# Patient Record
Sex: Male | Born: 1935
Health system: Southern US, Community
[De-identification: ages and names within clinical notes are randomized; demographics above are authoritative.]

## PROBLEM LIST (undated history)

## (undated) DIAGNOSIS — Z8546 Personal history of malignant neoplasm of prostate: Secondary | ICD-10-CM

## (undated) DIAGNOSIS — E785 Hyperlipidemia, unspecified: Secondary | ICD-10-CM

## (undated) DIAGNOSIS — I10 Essential (primary) hypertension: Secondary | ICD-10-CM

## (undated) DIAGNOSIS — E7800 Pure hypercholesterolemia, unspecified: Secondary | ICD-10-CM

## (undated) DIAGNOSIS — K219 Gastro-esophageal reflux disease without esophagitis: Secondary | ICD-10-CM

## (undated) DIAGNOSIS — C61 Malignant neoplasm of prostate: Secondary | ICD-10-CM

## (undated) DIAGNOSIS — N200 Calculus of kidney: Secondary | ICD-10-CM

## (undated) DIAGNOSIS — E119 Type 2 diabetes mellitus without complications: Secondary | ICD-10-CM

## (undated) DIAGNOSIS — M109 Gout, unspecified: Secondary | ICD-10-CM

## (undated) DIAGNOSIS — I700 Atherosclerosis of aorta: Secondary | ICD-10-CM

## (undated) HISTORY — DX: Gastro-esophageal reflux disease without esophagitis: K21.9

## (undated) HISTORY — DX: Atherosclerosis of aorta: I70.0

## (undated) HISTORY — DX: Malignant neoplasm of prostate: C61

## (undated) HISTORY — DX: Pure hypercholesterolemia, unspecified: E78.00

## (undated) HISTORY — DX: Type 2 diabetes mellitus without complications: E11.9

## (undated) HISTORY — DX: Calculus of kidney: N20.0

## (undated) HISTORY — DX: Essential (primary) hypertension: I10

## (undated) HISTORY — DX: Personal history of malignant neoplasm of prostate: Z85.46

## (undated) HISTORY — DX: Hyperlipidemia, unspecified: E78.5

## (undated) HISTORY — DX: Gout, unspecified: M10.9

---

## 1999-08-13 ENCOUNTER — Encounter: Admission: RE | Admit: 1999-08-13 | Discharge: 1999-08-13 | Payer: Self-pay | Admitting: *Deleted

## 2000-06-11 ENCOUNTER — Ambulatory Visit (HOSPITAL_COMMUNITY): Admission: RE | Admit: 2000-06-11 | Discharge: 2000-06-11 | Payer: Self-pay | Admitting: *Deleted

## 2000-10-11 ENCOUNTER — Encounter: Payer: Self-pay | Admitting: Emergency Medicine

## 2000-10-11 ENCOUNTER — Emergency Department (HOSPITAL_COMMUNITY): Admission: EM | Admit: 2000-10-11 | Discharge: 2000-10-11 | Payer: Self-pay | Admitting: Emergency Medicine

## 2002-04-17 ENCOUNTER — Encounter: Payer: Self-pay | Admitting: Emergency Medicine

## 2002-04-17 ENCOUNTER — Ambulatory Visit (HOSPITAL_BASED_OUTPATIENT_CLINIC_OR_DEPARTMENT_OTHER): Admission: RE | Admit: 2002-04-17 | Discharge: 2002-04-17 | Payer: Self-pay | Admitting: Urology

## 2002-04-17 ENCOUNTER — Encounter: Payer: Self-pay | Admitting: Urology

## 2002-04-17 ENCOUNTER — Emergency Department (HOSPITAL_COMMUNITY): Admission: EM | Admit: 2002-04-17 | Discharge: 2002-04-17 | Payer: Self-pay | Admitting: Emergency Medicine

## 2002-05-10 ENCOUNTER — Encounter: Admission: RE | Admit: 2002-05-10 | Discharge: 2002-05-10 | Payer: Self-pay | Admitting: Urology

## 2002-05-10 ENCOUNTER — Encounter: Payer: Self-pay | Admitting: Urology

## 2002-07-24 ENCOUNTER — Encounter: Payer: Self-pay | Admitting: Urology

## 2002-07-24 ENCOUNTER — Encounter: Admission: RE | Admit: 2002-07-24 | Discharge: 2002-07-24 | Payer: Self-pay | Admitting: Urology

## 2002-08-14 ENCOUNTER — Encounter: Admission: RE | Admit: 2002-08-14 | Discharge: 2002-08-14 | Payer: Self-pay | Admitting: Urology

## 2002-08-14 ENCOUNTER — Encounter: Payer: Self-pay | Admitting: Urology

## 2002-08-17 ENCOUNTER — Encounter: Payer: Self-pay | Admitting: Urology

## 2002-08-17 ENCOUNTER — Ambulatory Visit (HOSPITAL_BASED_OUTPATIENT_CLINIC_OR_DEPARTMENT_OTHER): Admission: RE | Admit: 2002-08-17 | Discharge: 2002-08-17 | Payer: Self-pay | Admitting: Urology

## 2002-09-07 ENCOUNTER — Encounter: Admission: RE | Admit: 2002-09-07 | Discharge: 2002-09-07 | Payer: Self-pay | Admitting: Urology

## 2002-09-07 ENCOUNTER — Encounter: Payer: Self-pay | Admitting: Urology

## 2002-09-14 ENCOUNTER — Encounter: Payer: Self-pay | Admitting: Emergency Medicine

## 2002-09-14 ENCOUNTER — Emergency Department (HOSPITAL_COMMUNITY): Admission: EM | Admit: 2002-09-14 | Discharge: 2002-09-14 | Payer: Self-pay | Admitting: Emergency Medicine

## 2003-09-03 ENCOUNTER — Encounter: Admission: RE | Admit: 2003-09-03 | Discharge: 2003-09-03 | Payer: Self-pay | Admitting: Family Medicine

## 2003-09-03 ENCOUNTER — Encounter: Payer: Self-pay | Admitting: Family Medicine

## 2004-04-01 ENCOUNTER — Emergency Department (HOSPITAL_COMMUNITY): Admission: EM | Admit: 2004-04-01 | Discharge: 2004-04-01 | Payer: Self-pay | Admitting: Emergency Medicine

## 2005-01-17 ENCOUNTER — Emergency Department (HOSPITAL_COMMUNITY): Admission: EM | Admit: 2005-01-17 | Discharge: 2005-01-17 | Payer: Self-pay | Admitting: Emergency Medicine

## 2005-04-11 ENCOUNTER — Emergency Department (HOSPITAL_COMMUNITY): Admission: EM | Admit: 2005-04-11 | Discharge: 2005-04-12 | Payer: Self-pay | Admitting: Emergency Medicine

## 2006-03-11 ENCOUNTER — Emergency Department (HOSPITAL_COMMUNITY): Admission: EM | Admit: 2006-03-11 | Discharge: 2006-03-12 | Payer: Self-pay | Admitting: Emergency Medicine

## 2007-06-09 ENCOUNTER — Emergency Department (HOSPITAL_COMMUNITY): Admission: EM | Admit: 2007-06-09 | Discharge: 2007-06-09 | Payer: Self-pay | Admitting: Emergency Medicine

## 2008-02-24 ENCOUNTER — Emergency Department (HOSPITAL_COMMUNITY): Admission: EM | Admit: 2008-02-24 | Discharge: 2008-02-25 | Payer: Self-pay | Admitting: Emergency Medicine

## 2010-07-21 ENCOUNTER — Ambulatory Visit: Admission: RE | Admit: 2010-07-21 | Discharge: 2010-08-06 | Payer: Self-pay | Admitting: Radiation Oncology

## 2010-09-08 ENCOUNTER — Ambulatory Visit
Admission: RE | Admit: 2010-09-08 | Discharge: 2010-11-17 | Payer: Self-pay | Source: Home / Self Care | Attending: Radiation Oncology | Admitting: Radiation Oncology

## 2010-09-10 ENCOUNTER — Ambulatory Visit (HOSPITAL_COMMUNITY): Admission: RE | Admit: 2010-09-10 | Discharge: 2010-09-10 | Payer: Self-pay | Admitting: Urology

## 2010-09-29 ENCOUNTER — Ambulatory Visit (HOSPITAL_COMMUNITY)
Admission: RE | Admit: 2010-09-29 | Discharge: 2010-09-29 | Payer: Self-pay | Source: Home / Self Care | Admitting: Urology

## 2010-11-30 ENCOUNTER — Encounter: Payer: Self-pay | Admitting: Radiation Oncology

## 2010-12-16 ENCOUNTER — Ambulatory Visit
Admission: RE | Admit: 2010-12-16 | Payer: BC Managed Care – PPO | Source: Ambulatory Visit | Admitting: Radiation Oncology

## 2011-03-27 NOTE — Procedures (Signed)
[place]. [facility]  Patient:    Steven Davenport, Steven Davenport                        MRN: [id number] Proc. Date: 06/11/00 Adm. Date:  [id number] Disc. Date: [id number] Attending:  Sharyn Dross                           Procedure Report  REFERRING PHYSICIAN:  Dr. Tad Moore.  PREOPERATIVE DIAGNOSIS:  Family history of colon cancer.  POSTOPERATIVE DIAGNOSIS:  Normal colonoscopic examination to the cecum.  PROCEDURE:  Colonoscopy.  MEDICATIONS: 1. Demerol 60 mg IV. 2. Versed 6 mg IV over a 10 minute period of time.  INSTRUMENT:  Olympus video pancolonoscope.  ENDOSCOPIST:  Dr. Tad Moore.  INDICATIONS FOR PROCEDURE:  This pleasant 76 year old gentleman presented to the office for a routine evaluation.  In discussion with the patient, the patient was found to have a strong family history of colon cancer which his mother was diagnosed with.  The patient subsequently was scheduled for a colonoscopic examination for the evaluation of this process.  PHYSICAL EXAMINATION:  GENERAL:  This is a pleasant gentleman in no distress.  VITAL SIGNS:  Stable.  HEENT:  Anicteric.  NECK:  Supple.  LUNGS:  Clear.  HEART:  Regular rate and rhythm without any heaves, rubs, murmurs, gallops.  ABDOMEN:  Soft, no tenderness, no hepatosplenomegaly appreciated.  EXTREMITIES:  Within normal limits.  RECTAL:  Digital rectal exam was normal.  PLAN:  To proceed with the colonoscopic examination.  INFORMED CONSENT:  The patient was advised of the procedure, indications, and risks involved.  The patient has agreed to have the procedure performed. Video was reviewed and consent form obtained.  PREOPERATIVE PREPARATION:  The patient was brought to the endoscopy unit with an IV for IV sedating medication.  Monitor was placed on the patient to monitor the patients vital signs and oxygen saturation.  Nasal oxygen at 2 L per minute was used, and after adequate sedation was performed the  procedure was begun.  DESCRIPTION OF PROCEDURE:  The instrument was advanced with the patient laying in the left lateral position to approximately 90 cm proximal to the cecal region.  This was confirmed by palpation and transillumination and visualization of the ileocecal valve region.  There appeared to be no gross abnormalities such as masses, polyps, or stricture lesions appreciated.  The vascular pattern appeared to be well within normal limits throughout the entire colon.  The mucosal pattern showed no evidence of any granular changes or diverticular changes at this time. There was no increased tortuosity in the colon, no evidence of internal or external hemorrhoids from the area noted.  The instrument was removed from the rectum without difficulty with the patient tolerating the procedure well.  TREATMENT: 1. Conservative management. 2. Based on the strong family history, I would recommend repeating the    colonoscopic examination in three years for further evaluation of the area.  Will have follow up in approximately one month post-procedure.  LEVEL OF DIFFICULTY:  Grade 1-2/5.  RECOMMENDATIONS:  I would recommend using a standard colonoscope for this procedure at this time. DD:  06/11/00 TD:  06/13/00 Job: [id number]

## 2011-03-27 NOTE — H&P (Signed)
[facility]  Patient:    Steven Davenport, Steven Davenport Visit Number: [id number] MRN: [id number]          Service Type: NES Location: [facility] Attending Physician:  Lindaann Slough Dictated by:   Lindaann Slough, M.D. Admit Date:  04/17/2002 Discharge Date: 04/17/2002                           History and Physical  CHIEF COMPLAINT:  Left flank pain.  HISTORY OF PRESENT ILLNESS:  The patient is a 76 year old male who was seen in the emergency room today with sudden onset of left flank pain associated with nausea and vomiting.  The patient has a past history of kidney stones.  A CT scan showed a 7 mm stone in the left and distal ureter with proximal hydronephrosis and a small stone in the lower pole of the left kidney.  The patient is now admitted for observation.  The plan is to treat him conservatively at this time.  However, if he has pain and pain is not relieved by analgesics, he will need stone manipulation.  If he remains asymptomatic, will then treat him with ESL of the kidney stone.  The risks and benefits of each option were discussed with the patient and he agrees to be admitted for observation.  PAST MEDICAL HISTORY: 1. He has a history of hypertension. 2. He has a history of hypercholesterolemia. 3. History of kidney stones.  FAMILY HISTORY:  Noncontributory.  SOCIAL HISTORY:  He is married and has nine children.  He does not smoke nor drink.  ALLERGIES:  No known drug allergies.  MEDICATIONS:  He is on medications for hypertension and hypercholesterolemia, however, he does not remember the names of these medications.  The patient was treated with IV Toradol and the pain recurred.  He was then given IV morphine.  REVIEW OF SYSTEMS:  Pulmonary:  No cough, no shortness of breath, and no hemoptysis.  Cardiovascular:  No palpitations.  No chest pain.  GI:  No nausea.  No vomiting.  No diarrhea or constipation.  GU:  As per history.  PHYSICAL  EXAMINATION:  The blood pressure is 179/81, pulse 67, respirations 14, and temperature 99 degrees.  HEENT:  His head is normal.  Pupils are equal, round, and reactive to light and accommodation.  Ears, nose, and throat within normal limits.  NECK:  Supple.  No cervical lymph nodes.  No thyromegaly.  CHEST:  Symmetrical.  LUNGS:  Fully expanded and clear to auscultation and percussion.  HEART:  Regular rhythm.  No murmurs.  No gallops.  ABDOMEN:  Soft, nondistended, and tender in the right flank.  He has right CVA tenderness.  The kidneys are not palpable.  The bladder is not distended. Bowel sounds normal.  GENITALIA:  The genitalia, penis, and scrotal contents are within normal limits.  RECTAL:  Examination is deferred.  IMPRESSION: 1. Left ureteral stone. 2. Left renal stone. 3. Hypertension. 4. Hypercholesterolemia. Dictated by:   Lindaann Slough, M.D. Attending Physician:  Lindaann Slough DD:  04/17/02 TD:  04/19/02 Job: [id number]

## 2011-08-24 LAB — POCT URINALYSIS DIP (DEVICE)
Ketones, ur: NEGATIVE
Protein, ur: NEGATIVE
pH: 7

## 2014-10-19 ENCOUNTER — Ambulatory Visit (INDEPENDENT_AMBULATORY_CARE_PROVIDER_SITE_OTHER): Payer: BC Managed Care – PPO

## 2014-10-19 ENCOUNTER — Ambulatory Visit (INDEPENDENT_AMBULATORY_CARE_PROVIDER_SITE_OTHER): Payer: BC Managed Care – PPO | Admitting: Podiatry

## 2014-10-19 ENCOUNTER — Encounter: Payer: Self-pay | Admitting: Podiatry

## 2014-10-19 DIAGNOSIS — S93401A Sprain of unspecified ligament of right ankle, initial encounter: Secondary | ICD-10-CM

## 2014-10-19 DIAGNOSIS — R52 Pain, unspecified: Secondary | ICD-10-CM

## 2014-10-19 NOTE — Patient Instructions (Signed)

## 2014-10-19 NOTE — Progress Notes (Signed)
   Subjective:    Patient ID: Steven Davenport, male    DOB: November 15, 1934, 79 y.o.   MRN: [id number]  HPI 80 year old male presents the office today with complaints of right ankle pain along the outside aspect of his ankle. He states that this pain started yesterday. He denies any specific injury or trauma to the area or any twisting of his ankle. He states that today the ankle does not hurt as much as it did yesterday. He states that he only has pain if he steps in a certain way, which he describes an inversion type mechanism. He has been soaking his foot and Epsom salts. No other complaints at this time.  Patient is diabetic and he states that his blood sugar typically run between 90-125. He denies any history of ulceration, any tingling or numbness or any claudication symptoms.   Review of Systems  Musculoskeletal: Positive for gait problem.  All other systems reviewed and are negative.      Objective:   Physical Exam AAO 3, NAD DP pulses 2/4 bilaterally, PT pulse 1/4 bilaterally, CRT less than 3 seconds Protective sensation intact with Horan Weinstein monofilament, vibratory sensation intact, Achilles tendon reflex intact There is tenderness directly over the ATFL on the right ankle. There is no tenderness overlying the CFL or PTFL. No tenderness over the syndesmosis, medial ankle ligaments. There is no pinpoint bony tenderness or pain with vibratory sensation overlying the lateral malleolus, medial malleolus, proximal leg. Mild discomfort with inversion. Anterior drawer test was performed and is negative and without discomfort. No pain along the course of the peroneal tendons. No pain to the left lower extremity.  MMT 5/5, ROM WNL No open lesions or pre-ulcer lesions No pain with calf compression, swelling, warmth, erythema.     Assessment & Plan:  79 year old male with right ankle pain, likely ATFL sprain -X-rays were obtained and reviewed with the patient. -Treatment options were  discussed including alternatives, risks, complications. -Dispensed ankle brace to wear if needed in the short-term. Discussed with the patient stretching/strengthening exercises to perform for his ankle. Should he have any increasing symptoms while performing these exercises to continue with the brace. Discussed the patient not to wear ankle brace long-term and to slowly wean off of it as the symptoms subside. -Ice to the area. -Follow-up in 3 weeks or sooner if any problems should arise. In the meantime, call the office with any questions, concerns, change in symptoms.

## 2014-10-22 ENCOUNTER — Ambulatory Visit: Payer: Self-pay | Admitting: Podiatry

## 2014-11-14 ENCOUNTER — Ambulatory Visit: Payer: BC Managed Care – PPO | Admitting: Podiatry

## 2015-06-14 ENCOUNTER — Ambulatory Visit: Payer: Self-pay | Admitting: Podiatry

## 2015-08-05 ENCOUNTER — Ambulatory Visit (INDEPENDENT_AMBULATORY_CARE_PROVIDER_SITE_OTHER): Payer: BLUE CROSS/BLUE SHIELD | Admitting: Podiatry

## 2015-08-05 ENCOUNTER — Encounter: Payer: Self-pay | Admitting: Podiatry

## 2015-08-05 ENCOUNTER — Other Ambulatory Visit: Payer: Self-pay | Admitting: Podiatry

## 2015-08-05 ENCOUNTER — Ambulatory Visit (INDEPENDENT_AMBULATORY_CARE_PROVIDER_SITE_OTHER): Payer: BLUE CROSS/BLUE SHIELD

## 2015-08-05 VITALS — BP 149/80 | HR 61 | Resp 16

## 2015-08-05 DIAGNOSIS — M205X2 Other deformities of toe(s) (acquired), left foot: Secondary | ICD-10-CM | POA: Diagnosis not present

## 2015-08-05 DIAGNOSIS — M2011 Hallux valgus (acquired), right foot: Secondary | ICD-10-CM

## 2015-08-05 DIAGNOSIS — M779 Enthesopathy, unspecified: Secondary | ICD-10-CM

## 2015-08-05 DIAGNOSIS — M79674 Pain in right toe(s): Secondary | ICD-10-CM | POA: Diagnosis not present

## 2015-08-05 NOTE — Progress Notes (Signed)
Patient ID: Steven Davenport, male   DOB: 04/26/1935, 81 y.o.   MRN: [id number]  Subjective: 80 year old male presents the office today for concerns obtained of the big toe on the right side which started a proximal 1 month ago. He states at that time he participates steel toed shoes and he has a bump on the inside portion of his big toe which is causing pressure resulted in pain. He would his primary care physician's that he needed to get an injection to the area. He denies any redness overlying the area however it does swell intimately. There is no pain with range of motion of the big toe joint although is only over the follow-up. No recent injury of,. He denies any systemic complaints as fevers, chills, nausea, vomiting. No calf pain, chest pain concerns of breath.  Objective: AAO x3, NAD DP/PT pulses palpable bilaterally, CRT less than 3 seconds Protective sensation intact with Derrel Nip monofilament there is a moderate HAV present on the right foot without eminence of the first metatarsal head prominence. There is tenderness palpation to the overlying the first metatarsal head medially. There is no pain with MTPJ range of motion although the range of motion is limited. There is trace edema overlying the first MTPJ without any associated erythema or increase in warmth. There is no other areas of tenderness to bilateral lower extremity is. No other areas of edema, erythema, increase in warmth.  MMT 5/5, ROM WNL except for otherwise stated.  No open lesions or pre-ulcerative lesions.  No overlying edema, erythema, increase in warmth to bilateral lower extremities.  No pain with calf compression, swelling, warmth, erythema bilaterally.   Assessment:  80 year old male with right symptomatic HAV, hallux limitus, capsulitis   Plan: -X-rays were obtained and reviewed with the patient.  -Treatment options discussed including all alternatives, risks, and complications  Discussed both conservative and  surgical treatment options. At this time he is requesting a steroid injection into the symptomatic area. Risks and Occasions were discussed. Under sterile conditions a total of 2 mL of a one-to-one mixture of 0.5% Marcaine plain was infiltrated into the symptomatic area on the first metatarsal head medially on the right foot as well as the first MTPJ. He tolerated injection well any consultations. Post injection care was discussed. -Offloading pads were dispensed. -Follow-up as needed. Call the office with any questions, concerns, change in symptoms in the meantime.  Celesta Gentile, DPM

## 2015-09-30 ENCOUNTER — Ambulatory Visit (INDEPENDENT_AMBULATORY_CARE_PROVIDER_SITE_OTHER): Payer: BLUE CROSS/BLUE SHIELD | Admitting: Podiatry

## 2015-09-30 ENCOUNTER — Encounter: Payer: Self-pay | Admitting: Podiatry

## 2015-09-30 VITALS — BP 125/100 | HR 63 | Resp 16

## 2015-09-30 DIAGNOSIS — M1000 Idiopathic gout, unspecified site: Secondary | ICD-10-CM | POA: Diagnosis not present

## 2015-09-30 DIAGNOSIS — M779 Enthesopathy, unspecified: Secondary | ICD-10-CM

## 2015-09-30 MED ORDER — TRIAMCINOLONE ACETONIDE 10 MG/ML IJ SUSP
10.0000 mg | Freq: Once | INTRAMUSCULAR | Status: AC
  Administered 2015-09-30: 10 mg

## 2015-09-30 NOTE — Patient Instructions (Signed)

## 2015-10-01 NOTE — Progress Notes (Signed)
Subjective:     Patient ID: Steven Davenport, male   DOB: Mar 11, 1935, 80 y.o.   MRN: [id number]  HPI patient states I started to develop swelling and pain around my big toe joint of my right foot again   Review of Systems     Objective:   Physical Exam Neurovascular status intact negative Homans sign noted with redness and pain around the first MPJ right with a history of this having occurred little bit over 2 months ago    Assessment:     Inflammatory capsulitis with possibility for gout symptomatology right secondary to the way this has presented    Plan:     Reviewed condition at great length and discussed gout and gave him it sheet to review concerning foods to avoid and diet modification. Injected around the first MPJ 3 mg Kenalog 5 mill grams Xylocaine to reduce inflammation and reappoint to recheck

## 2015-11-06 ENCOUNTER — Ambulatory Visit (INDEPENDENT_AMBULATORY_CARE_PROVIDER_SITE_OTHER): Payer: BLUE CROSS/BLUE SHIELD | Admitting: Podiatry

## 2015-11-06 ENCOUNTER — Encounter: Payer: Self-pay | Admitting: Podiatry

## 2015-11-06 DIAGNOSIS — M1000 Idiopathic gout, unspecified site: Secondary | ICD-10-CM

## 2015-11-06 DIAGNOSIS — M779 Enthesopathy, unspecified: Secondary | ICD-10-CM

## 2015-11-06 MED ORDER — TRIAMCINOLONE ACETONIDE 10 MG/ML IJ SUSP
10.0000 mg | Freq: Once | INTRAMUSCULAR | Status: AC
  Administered 2015-11-06: 10 mg

## 2015-11-07 NOTE — Progress Notes (Signed)
Subjective:     Patient ID: Steven Davenport, male   DOB: Feb 21, 1935, 80 y.o.   MRN: [id number]  HPI the area that you put the medicine and is doing pretty well but I'm having pain on the inside of the joint that is bothering me when I try to walk. Probably was there from the beginning but I could not feel   Review of Systems     Objective:   Physical Exam Neurovascular status intact muscle strength adequate range of motion within normal limits with patient found to have discomfort on the inside of the first MPJ right with fluid buildup and discomfort on the outside which is done well with medication    Assessment:     Inflammatory capsulitis with hallux limitus condition and also possible gout-like condition    Plan:     Injected the inside of the joint 3 mg Kenalog 5 mg Xylocaine and advised on physical therapy and reappoint to recheck

## 2015-12-05 ENCOUNTER — Telehealth: Payer: Self-pay | Admitting: *Deleted

## 2015-12-05 MED ORDER — MELOXICAM 15 MG PO TABS: 15.0000 mg | ORAL_TABLET | Freq: Every day | ORAL | Status: DC

## 2015-12-05 NOTE — Telephone Encounter (Addendum)
Pt's dtr, Lorriane Shire states the Dr. Paulla Dolly has been injecting pt's toe, and said next time it hurt he would give him another medication.  Dr. Paulla Dolly ordered Mobic 15mg  #30 1 tablet daily, +1refill. Orders to pt's dtr Lorriane Shire, and [employer].

## 2015-12-23 ENCOUNTER — Ambulatory Visit (INDEPENDENT_AMBULATORY_CARE_PROVIDER_SITE_OTHER): Payer: BLUE CROSS/BLUE SHIELD | Admitting: Podiatry

## 2015-12-23 ENCOUNTER — Ambulatory Visit (INDEPENDENT_AMBULATORY_CARE_PROVIDER_SITE_OTHER): Payer: BLUE CROSS/BLUE SHIELD

## 2015-12-23 DIAGNOSIS — M779 Enthesopathy, unspecified: Secondary | ICD-10-CM

## 2015-12-23 DIAGNOSIS — M79671 Pain in right foot: Secondary | ICD-10-CM

## 2015-12-23 DIAGNOSIS — M205X9 Other deformities of toe(s) (acquired), unspecified foot: Secondary | ICD-10-CM | POA: Diagnosis not present

## 2015-12-25 NOTE — Progress Notes (Signed)
Patient ID: Steven Davenport, male   DOB: 1935/02/01, 81 y.o.   MRN: [id number]  Subjective: 81 year old male presents the office today for follow-up evaluation of pain in his right big toe. He states that since taking the anti-inflammatories and steroid injection the pain to his big toe joint has greatly improved. Discontinue have some pain when trying to bend his toe. He gets some intermittent swelling to this area as well but denies any redness or increase in warmth. There is no injury or trauma to the area. He denies any systemic complaints as fevers, chills, nausea, vomiting. No calf pain, chest pain concerns of breath.  Objective: AAO x3, NAD DP/PT pulses palpable bilaterally, CRT less than 3 seconds Protective sensation intact with Derrel Nip monofilament  There is a moderate HAV present on the right foot without eminence of the first metatarsal head prominence. There is pain with first MTPJ range of motion of the right side particularly in dorsiflexion range of motion is limited. There is mild edema to the joint but no erythema or increase in warmth. No other areas of tenderness to bilateral lower extremities.  No open lesions or pre-ulcerative lesions.  No overlying edema, erythema, increase in warmth to bilateral lower extremities.  No pain with calf compression, swelling, warmth, erythema bilaterally.   Assessment:  81 year old male with right symptomatic HAV, hallux limitus, capsulitis   Plan: -X-rays were obtained and reviewed with the patient.  -Treatment options discussed including all alternatives, risks, and complication. At this time his symptoms are greatly improved however he does continue pain with range of motion. Has a sent to think he may benefit from orthotics to help support his foot and take pressure off the first MTPJ with a Morton's extension. He will to proceed with orthotics today -Follow-up to [facility]. Call the office with any questions, concerns, change in symptoms in  the meantime.  Celesta Gentile, DPM

## 2016-01-21 ENCOUNTER — Ambulatory Visit: Payer: BLUE CROSS/BLUE SHIELD | Admitting: *Deleted

## 2016-01-21 DIAGNOSIS — R52 Pain, unspecified: Secondary | ICD-10-CM

## 2016-01-21 NOTE — Progress Notes (Signed)
Patient ID: Steven Davenport, male   DOB: Jul 02, 1935, 81 y.o.   MRN: [id number] Patient presents for orthotic pick up.  Verbal and written break in and wear instructions given.  Patient will follow up in 4 weeks if symptoms worsen or fail to improve.

## 2016-01-21 NOTE — Patient Instructions (Signed)

## 2017-03-02 ENCOUNTER — Telehealth: Payer: Self-pay | Admitting: Sports Medicine

## 2017-03-02 MED ORDER — MELOXICAM 15 MG PO TABS: 15.0000 mg | ORAL_TABLET | Freq: Every day | ORAL | 0 refills | Status: DC

## 2017-03-02 NOTE — Telephone Encounter (Signed)
PATIENT'S DAUGHTER CALLED STATING THAT HER DAD HAS PAIN AND SWELLING IN ANLEAND FOOT AFTER WORK. HE SAW DR. Paulla Dolly LAST YEAR AND WAS GIVEN AN MEDICATION THAT HELPED. DAUGHTER DENIES INJURY. I ADVISED PATIENT TO GO TO ER OR URGENT CARE IF PAIN IS BAD. DAUGHTER INSISTS THAT SHE WANTS TO WAIT UNTIL THE AM TO CALL OFFICE FOR AN APPOINTMENT. MEANWHILE I RECOMMEND PROTECTION, ICE, ELEVATION AND REST. I ALSO SENT TO PHARMACY [facility] FOR THE TIME BEING. ADVISED DAUGHTER IF PHARMACY IS CLOSED TO TAKE MOTRIN INSTEAD. DAUGHTER EXPRESSED UNDERSTANDING AND WILL CALL OFFICE FOR APPT FOR HER DAD IN THE MORNING IF PAIN IS NOT BETTER -DR. Jacklyne Baik

## 2017-03-03 ENCOUNTER — Ambulatory Visit (INDEPENDENT_AMBULATORY_CARE_PROVIDER_SITE_OTHER): Payer: BLUE CROSS/BLUE SHIELD | Admitting: Podiatry

## 2017-03-03 ENCOUNTER — Encounter: Payer: Self-pay | Admitting: Podiatry

## 2017-03-03 ENCOUNTER — Ambulatory Visit (INDEPENDENT_AMBULATORY_CARE_PROVIDER_SITE_OTHER): Payer: BLUE CROSS/BLUE SHIELD

## 2017-03-03 DIAGNOSIS — M779 Enthesopathy, unspecified: Secondary | ICD-10-CM

## 2017-03-03 DIAGNOSIS — M25572 Pain in left ankle and joints of left foot: Secondary | ICD-10-CM | POA: Diagnosis not present

## 2017-03-03 DIAGNOSIS — M7752 Other enthesopathy of left foot: Secondary | ICD-10-CM

## 2017-03-03 DIAGNOSIS — M778 Other enthesopathies, not elsewhere classified: Secondary | ICD-10-CM

## 2017-03-03 MED ORDER — TRIAMCINOLONE ACETONIDE 10 MG/ML IJ SUSP
10.0000 mg | Freq: Once | INTRAMUSCULAR | Status: AC
  Administered 2017-03-03: 10 mg

## 2017-03-03 NOTE — Progress Notes (Signed)
Subjective:    Patient ID: Steven Davenport, male   DOB: 82 y.o.   MRN: [id number]   HPI patient presents stating he's getting a lot of pain in his left ankle and it's been going on for a period of time and worse over the last few months    ROS      Objective:  Physical Exam Neurovascular status intact with patient noted to have mild edema in the left ankle with negative Homans sign and quite a bit of discomfort mostly in the sinus tarsi but when I inverted and everted the ankle    Assessment:    Sinus tarsitis left with ankle pain which may be due to some kind of instability     Plan:     H&P conditions reviewed and sinus tarsi injection administered left 3 mg Kenalog 5 mg Xylocaine and instructed on anti-inflammatories and I did dispense fascial brace to hold the foot in a more neutral position and to try to leave her foot slightly. Patient will be seen back to recheck again in the next several weeks  X-ray of the left ankle indicated moderate arthritis with no indications of fracture or advanced pathology

## 2017-03-24 ENCOUNTER — Ambulatory Visit: Payer: BLUE CROSS/BLUE SHIELD | Admitting: Podiatry

## 2017-04-07 ENCOUNTER — Ambulatory Visit (INDEPENDENT_AMBULATORY_CARE_PROVIDER_SITE_OTHER): Payer: BLUE CROSS/BLUE SHIELD | Admitting: Podiatry

## 2017-04-07 DIAGNOSIS — M779 Enthesopathy, unspecified: Secondary | ICD-10-CM

## 2017-04-07 DIAGNOSIS — M79671 Pain in right foot: Secondary | ICD-10-CM

## 2017-04-07 NOTE — Progress Notes (Signed)
Subjective:    Patient ID: Steven Davenport, male   DOB: 82 y.o.   MRN: [id number]   HPI patient states his foot is feeling much better left with diminished symptoms and also complains his right big toe can be bothersome    ROS      Objective:  Physical Exam neurovascular status intact with patient found to have significant diminishment of discomfort in the left sinus tarsi with the right foot showing lesion formation     Assessment:    Sinus tarsitis left improved with lesion formation and mild hallux limitus deformity right     Plan:    Reviewed condition and recommended at this time that we just go ahead and wear supportive shoes utilize physical therapy and soaks and will be seen back to recheck

## 2017-05-13 ENCOUNTER — Other Ambulatory Visit: Payer: Self-pay | Admitting: Family Medicine

## 2017-05-13 ENCOUNTER — Ambulatory Visit
Admission: RE | Admit: 2017-05-13 | Discharge: 2017-05-13 | Disposition: A | Discharge status: Home / Self Care | Payer: BLUE CROSS/BLUE SHIELD | Source: Ambulatory Visit | Attending: Family Medicine | Admitting: Family Medicine

## 2017-05-13 DIAGNOSIS — M25511 Pain in right shoulder: Secondary | ICD-10-CM

## 2017-07-09 ENCOUNTER — Ambulatory Visit (INDEPENDENT_AMBULATORY_CARE_PROVIDER_SITE_OTHER): Payer: Self-pay | Admitting: Podiatry

## 2017-07-09 ENCOUNTER — Ambulatory Visit (INDEPENDENT_AMBULATORY_CARE_PROVIDER_SITE_OTHER): Payer: BLUE CROSS/BLUE SHIELD

## 2017-07-09 DIAGNOSIS — M205X1 Other deformities of toe(s) (acquired), right foot: Secondary | ICD-10-CM

## 2017-07-09 DIAGNOSIS — M779 Enthesopathy, unspecified: Secondary | ICD-10-CM

## 2017-07-09 MED ORDER — MELOXICAM 7.5 MG PO TABS: 7.5000 mg | ORAL_TABLET | Freq: Every day | ORAL | 0 refills | Status: AC

## 2017-07-09 NOTE — Progress Notes (Signed)
   Subjective:    Patient ID: Steven Davenport, male    DOB: May 16, 1935, 82 y.o.   MRN: [id number]  HPI 82 y.o. male presents today for R great toe pain. Started yesterday. Reports redness, swelling, and stiffness. Denies changes to diet.  Review of Systems    Objective:   Physical Exam There were no vitals filed for this visit. General AA&O x3. Normal mood and affect.  Vascular Dorsalis pedis and posterior tibial pulses  present 2+ bilaterally  Capillary refill normal to all digits.  Neurologic Epicritic sensation grossly present.  Dermatologic No open lesions. Interspaces clear of maceration. Nails well groomed and normal in appearance. R 1st MPJ with edema.  Orthopedic: Reduced ROM R 1st MPJ. Pain on ROM of R 1st MPJ.   Radiographs: Taken and reviewed. No acute fractures or dislocations. No other osseous abnormalities. No osseous erosions. No interval changes.    Assessment & Plan:  Capsulitis, ?Gout Attack -XR reviewed as above. -Injection delivered R 1st MPJ as below. -Educated on possible etiologies. -Rx Meloxicam for pain and inflammation  Procedure: Joint Injection Location: Right 1st MPJ joint Skin Prep: Alcohol. Injectate: 0.5 cc 1% lidocaine plain, 0.5 cc dexamethasone phosphate. Disposition: Patient tolerated procedure well. Injection site dressed with a band-aid. Patient verbalized relief post-injection.

## 2017-08-06 ENCOUNTER — Ambulatory Visit: Payer: BLUE CROSS/BLUE SHIELD | Admitting: Podiatry

## 2017-08-11 ENCOUNTER — Encounter: Payer: Self-pay | Admitting: Podiatry

## 2017-08-11 ENCOUNTER — Ambulatory Visit (INDEPENDENT_AMBULATORY_CARE_PROVIDER_SITE_OTHER): Payer: BLUE CROSS/BLUE SHIELD | Admitting: Podiatry

## 2017-08-11 VITALS — BP 127/72 | HR 78 | Resp 16

## 2017-08-11 DIAGNOSIS — M779 Enthesopathy, unspecified: Secondary | ICD-10-CM

## 2017-08-11 DIAGNOSIS — L600 Ingrowing nail: Secondary | ICD-10-CM | POA: Diagnosis not present

## 2017-08-11 NOTE — Patient Instructions (Addendum)

## 2017-08-12 ENCOUNTER — Telehealth: Payer: Self-pay | Admitting: Podiatry

## 2017-08-12 NOTE — Telephone Encounter (Signed)
I informed pt that as long as he kept the toe covered and wore a loose shoe, then he could go to the store, but to remember the longer he was up on the foot the greater chance for pain and swelling. Pt states understanding.

## 2017-08-12 NOTE — Progress Notes (Signed)
Subjective:    Patient ID: Steven Davenport, male   DOB: 82 y.o.   MRN: [id number]   HPI patient states I've got a painful ingrown toenail my right big toe and the big toe joint is improved but still can get sore at times    ROS      Objective:  Physical Exam neurovascular status intact with patient's right hallux medial border incurvated sore when pressed with distal redness and mild redness around the first MPJ right which has improved     Assessment:    Inflammatory capsulitis with probable gout first MPJ that's improved with ingrown toenail hallux right     Plan:   H&P condition reviewed discussed condition. At this point I recommended food modification for the first MPJ and explained him what to do and for the nail I recommended removal of the corner and explained procedure and risk. Patient signed consent form after review and today infiltrated the right hallux 60 mg like Marcaine mixture remove the medial border exposed matrix and applied phenol 3 applications 30 seconds followed by alcohol lavaged sterile dressing. Gave instructions on soaks and reappoint

## 2017-08-12 NOTE — Telephone Encounter (Signed)
I was seen yesterday for my ingrown toenail. Everything looks and is doing good. I was wondering, if I wore shoes that does not rub that toe, is it okay for me to walk on it or go to the store? My daughter thinks I need to stay home, but I think I can go outside and walk as long as I don't hit that toe or rub up against it. I got my toe out, just calling to check. My cell phone number is [phone]. Please call me back. Thank you.

## 2017-09-10 ENCOUNTER — Ambulatory Visit (INDEPENDENT_AMBULATORY_CARE_PROVIDER_SITE_OTHER): Payer: BLUE CROSS/BLUE SHIELD | Admitting: Podiatry

## 2017-09-10 ENCOUNTER — Encounter: Payer: Self-pay | Admitting: Podiatry

## 2017-09-10 DIAGNOSIS — M7752 Other enthesopathy of left foot: Secondary | ICD-10-CM | POA: Diagnosis not present

## 2017-09-10 DIAGNOSIS — M779 Enthesopathy, unspecified: Secondary | ICD-10-CM

## 2017-09-10 MED ORDER — TRIAMCINOLONE ACETONIDE 10 MG/ML IJ SUSP
10.0000 mg | Freq: Once | INTRAMUSCULAR | Status: AC
  Administered 2017-09-10: 10 mg

## 2017-09-14 NOTE — Progress Notes (Signed)
Subjective:    Patient ID: Steven Davenport, male   DOB: 82 y.o.   MRN: [id number]   HPI patient states the left heel has started to hurt and make it hard to walk    ROS      Objective:  Physical Exam neurovascular status intact with inflammation plantar aspect left heel insertional point tendon calcaneus     Assessment:  Reoccurrence acute inflammation plantar left heel       Plan:    Injected the left plantar fashion 3 mg Kenalog 5 mill grams Xylocaine and instructed on physical therapy shoe gear modification reappoint her recheck

## 2018-04-28 ENCOUNTER — Ambulatory Visit (INDEPENDENT_AMBULATORY_CARE_PROVIDER_SITE_OTHER): Payer: Commercial Managed Care - PPO

## 2018-04-28 ENCOUNTER — Encounter: Payer: Self-pay | Admitting: Podiatry

## 2018-04-28 ENCOUNTER — Ambulatory Visit (INDEPENDENT_AMBULATORY_CARE_PROVIDER_SITE_OTHER): Payer: Commercial Managed Care - PPO | Admitting: Podiatry

## 2018-04-28 DIAGNOSIS — M79672 Pain in left foot: Secondary | ICD-10-CM

## 2018-04-28 DIAGNOSIS — M779 Enthesopathy, unspecified: Secondary | ICD-10-CM | POA: Diagnosis not present

## 2018-04-28 DIAGNOSIS — M1000 Idiopathic gout, unspecified site: Secondary | ICD-10-CM

## 2018-04-28 MED ORDER — METHYLPREDNISOLONE 4 MG PO TBPK: ORAL_TABLET | ORAL | 0 refills | Status: DC

## 2018-04-28 MED ORDER — TRIAMCINOLONE ACETONIDE 10 MG/ML IJ SUSP
10.0000 mg | Freq: Once | INTRAMUSCULAR | Status: AC
  Administered 2018-04-28: 10 mg

## 2018-04-28 NOTE — Progress Notes (Signed)
Subjective:   Patient ID: Steven Davenport, male   DOB: 83 y.o.   MRN: [id number]   HPI Patient states the left foot is sore across the top and also he is concerned about the big toe joint stating he had gout which he thinks was in the other foot neurovascular status intact with quite a bit of acute inflammation dorsum left foot and the tendinous complex   ROS      Objective:  Physical Exam  Mild inflammation around the first MPJ left foot     Assessment:  Probability for inflammatory tendinitis left with possibility for gout symptomatology left and separate area H&P     Plan:  Gout education rendered to patient with explanations and today I injected the dorsal tendon complex 3 mg Kenalog 5 mg Xylocaine and instructed on ice therapy.  Reappoint for [place] to recheck again in the next several weeks  X-rays indicate that there is no indications of stress fracture or advanced arthritis noted

## 2018-05-05 ENCOUNTER — Encounter: Payer: Self-pay | Admitting: Podiatry

## 2018-05-05 ENCOUNTER — Ambulatory Visit (INDEPENDENT_AMBULATORY_CARE_PROVIDER_SITE_OTHER): Payer: Commercial Managed Care - PPO | Admitting: Podiatry

## 2018-05-05 DIAGNOSIS — M1000 Idiopathic gout, unspecified site: Secondary | ICD-10-CM | POA: Diagnosis not present

## 2018-05-05 DIAGNOSIS — M7752 Other enthesopathy of left foot: Secondary | ICD-10-CM | POA: Diagnosis not present

## 2018-05-05 DIAGNOSIS — M779 Enthesopathy, unspecified: Secondary | ICD-10-CM

## 2018-05-06 NOTE — Progress Notes (Signed)
Subjective:   Patient ID: Steven Davenport, male   DOB: 83 y.o.   MRN: [id number]   HPI Patient presents stating that the left foot is doing a lot better with significant reduction of the swelling   ROS      Objective:  Physical Exam  Neurovascular status intact with significant reduction in inflammation around the first metatarsal head left with fluid buildup that is reduced at the current time     Assessment:  Doing better post probable gout or inflammatory capsulitis around the first MPJ left     Plan:  Discussed continued diet modification ice therapy wider shoes and patient is discharged unless needed

## 2018-05-09 ENCOUNTER — Ambulatory Visit (INDEPENDENT_AMBULATORY_CARE_PROVIDER_SITE_OTHER): Payer: Commercial Managed Care - PPO | Admitting: Podiatry

## 2018-05-09 ENCOUNTER — Encounter: Payer: Self-pay | Admitting: Podiatry

## 2018-05-09 ENCOUNTER — Other Ambulatory Visit: Payer: Self-pay | Admitting: Podiatry

## 2018-05-09 ENCOUNTER — Ambulatory Visit (INDEPENDENT_AMBULATORY_CARE_PROVIDER_SITE_OTHER): Payer: Commercial Managed Care - PPO

## 2018-05-09 DIAGNOSIS — M779 Enthesopathy, unspecified: Secondary | ICD-10-CM

## 2018-05-09 DIAGNOSIS — M79672 Pain in left foot: Secondary | ICD-10-CM

## 2018-05-09 DIAGNOSIS — M1000 Idiopathic gout, unspecified site: Secondary | ICD-10-CM

## 2018-05-09 MED ORDER — TRIAMCINOLONE ACETONIDE 10 MG/ML IJ SUSP
10.0000 mg | Freq: Once | INTRAMUSCULAR | Status: AC
  Administered 2018-05-09: 10 mg

## 2018-05-09 NOTE — Patient Instructions (Signed)

## 2018-05-09 NOTE — Progress Notes (Signed)
Subjective:   Patient ID: Steven Davenport, male   DOB: 83 y.o.   MRN: [id number]   HPI Patient presents stating my big toe joint is doing fine but now is starting to develop a lot of pain on the outside of my left foot and is making it hard for me to walk   ROS      Objective:  Physical Exam  Neurovascular status intact with exquisite discomfort on the lateral aspect of the left foot with inflammation of the tendon complex peroneal with fluid buildup around the area     Assessment:  Tendinitis of the left lateral foot with possibility of gout or other systemic disease     Plan:  H&P reviewed conditions and today I am sending for blood work to try to rule out the possibility that there may be a condition occurring and I reviewed gout with the patient.  I then took x-rays of the foot and reviewed and I carefully injected the tendon complex 3 mg Kenalog 5 mg Xylocaine and we will see him back again in 2 weeks  X-ray indicates that there is no signs of stress fracture or advanced arthritis

## 2018-05-11 LAB — URIC ACID: Uric Acid, Serum: 8.3 mg/dL — ABNORMAL HIGH (ref 4.0–8.0)

## 2018-05-11 LAB — ANA, IFA COMPREHENSIVE PANEL
ANA: NEGATIVE
ENA SM Ab Ser-aCnc: <1 AI
SM/RNP: NEGATIVE AI
SSA (RO) (ENA) ANTIBODY, IGG: NEGATIVE AI
SSB (La) (ENA) Antibody, IgG: <1 AI
Scleroderma (Scl-70) (ENA) Antibody, IgG: <1 AI

## 2018-05-11 LAB — C-REACTIVE PROTEIN: CRP: 7.1 mg/L (ref <8.0)

## 2018-05-11 LAB — SEDIMENTATION RATE: SED RATE: 9 mm/h (ref 0–20)

## 2018-05-11 LAB — RHEUMATOID FACTOR: Rhuematoid fact SerPl-aCnc: <14 IU/mL (ref <14)

## 2018-05-23 ENCOUNTER — Ambulatory Visit (INDEPENDENT_AMBULATORY_CARE_PROVIDER_SITE_OTHER): Payer: Commercial Managed Care - PPO | Admitting: Podiatry

## 2018-05-23 ENCOUNTER — Encounter: Payer: Self-pay | Admitting: Podiatry

## 2018-05-23 DIAGNOSIS — M1000 Idiopathic gout, unspecified site: Secondary | ICD-10-CM | POA: Diagnosis not present

## 2018-05-23 DIAGNOSIS — M779 Enthesopathy, unspecified: Secondary | ICD-10-CM | POA: Diagnosis not present

## 2018-05-25 NOTE — Progress Notes (Signed)
Subjective:   Patient ID: Steven Davenport, male   DOB: 83 y.o.   MRN: [id number]   HPI Patient states foot feels a lot better and he needs to review blood work   ROS      Objective:  Physical Exam  Neurovascular status intact with patient's left foot doing well with patient noted to have significant reduction of inflammation lateral side of the foot with no pain in the first MPJ     Assessment:  Probable gout of the left foot which did give to episodes recently     Plan:  Reviewed gout and foods to be careful with and we reviewed his blood work indicating an elevation of his uric acid of 8.3.  I gave instructions that if this were to persist we will have to consider medicines to be taken on a consistent basis but at this point we are just can watch it as he is doing so much better.  Patient will be seen back if symptoms indicate

## 2018-06-14 ENCOUNTER — Other Ambulatory Visit: Payer: Self-pay | Admitting: Urology

## 2018-06-14 DIAGNOSIS — C61 Malignant neoplasm of prostate: Secondary | ICD-10-CM

## 2018-06-24 ENCOUNTER — Telehealth: Payer: Self-pay | Admitting: Podiatry

## 2018-06-24 NOTE — Telephone Encounter (Signed)
Patient needs something for pain. He uses the [facility] on [address].

## 2018-06-27 ENCOUNTER — Ambulatory Visit (INDEPENDENT_AMBULATORY_CARE_PROVIDER_SITE_OTHER): Payer: Commercial Managed Care - PPO | Admitting: Podiatry

## 2018-06-27 ENCOUNTER — Encounter: Payer: Self-pay | Admitting: Podiatry

## 2018-06-27 DIAGNOSIS — M1000 Idiopathic gout, unspecified site: Secondary | ICD-10-CM | POA: Diagnosis not present

## 2018-06-27 DIAGNOSIS — M7752 Other enthesopathy of left foot: Secondary | ICD-10-CM

## 2018-06-27 DIAGNOSIS — M779 Enthesopathy, unspecified: Secondary | ICD-10-CM

## 2018-06-27 MED ORDER — ALLOPURINOL 100 MG PO TABS: 100.0000 mg | ORAL_TABLET | Freq: Every day | ORAL | 6 refills | Status: AC

## 2018-06-27 MED ORDER — TRIAMCINOLONE ACETONIDE 10 MG/ML IJ SUSP
10.0000 mg | Freq: Once | INTRAMUSCULAR | Status: AC
Start: 2018-06-27 — End: ?

## 2018-06-27 NOTE — Progress Notes (Signed)
Subjective:   Patient ID: Steven Davenport, male   DOB: 83 y.o.   MRN: [id number]   HPI Patient states he was doing excellent for a few weeks and then developed another flareup in the outside of his left foot   ROS      Objective:  Physical Exam  Neurovascular status intact with patient found to have inflammation of the left lateral foot localized that appears to be again an acute inflammatory process     Assessment:  Acute tendinitis lateral left foot with possibility for gout     Plan:  Sterile prep and injected the left tendon complex 3 mg Kenalog 5 mg Xylocaine and then went ahead discussed gout and I am placing him on allopurinol and he would probably be best if he can get off of his thiazide as this is probably part of the precipitating factor for the condition.  He is to see his family physician to discuss this and we will see how he responds to allopurinol

## 2018-07-01 ENCOUNTER — Encounter (HOSPITAL_COMMUNITY)
Admission: RE | Admit: 2018-07-01 | Discharge: 2018-07-01 | Disposition: A | Discharge status: Home / Self Care | Payer: Commercial Managed Care - PPO | Source: Ambulatory Visit | Attending: Urology | Admitting: Urology

## 2018-07-01 DIAGNOSIS — C61 Malignant neoplasm of prostate: Secondary | ICD-10-CM | POA: Diagnosis not present

## 2018-07-01 MED ORDER — TECHNETIUM TC 99M MEDRONATE IV KIT
20.0000 | PACK | Freq: Once | INTRAVENOUS | Status: AC | PRN
  Administered 2018-07-01: 21 via INTRAVENOUS

## 2018-07-13 ENCOUNTER — Ambulatory Visit (INDEPENDENT_AMBULATORY_CARE_PROVIDER_SITE_OTHER): Payer: Commercial Managed Care - PPO | Admitting: Podiatry

## 2018-07-13 ENCOUNTER — Encounter: Payer: Self-pay | Admitting: Podiatry

## 2018-07-13 DIAGNOSIS — M1000 Idiopathic gout, unspecified site: Secondary | ICD-10-CM

## 2018-07-13 DIAGNOSIS — M779 Enthesopathy, unspecified: Secondary | ICD-10-CM

## 2018-07-14 NOTE — Progress Notes (Signed)
Subjective:   Patient ID: Steven Davenport, male   DOB: 83 y.o.   MRN: [id number]   HPI Patient presents stating that the left foot is feeling a lot better with mild discomfort on the dorsal surface   ROS      Objective:  Physical Exam  Neurovascular status intact with patient's left dorsal foot improved with pain still noted but minimal in its intensity     Assessment:  Inflammatory changes of the left dorsal foot consistent with gout that continues to improve     Plan:  At this point I recommended ice therapy continued modification of shoe gear and patient will be seen back on an as-needed basis.  Patient will take medicine for the gout and being followed by family physician and will see [place] if any flareups occur

## 2019-02-08 ENCOUNTER — Other Ambulatory Visit: Payer: Self-pay | Admitting: Podiatry

## 2019-02-08 ENCOUNTER — Ambulatory Visit (INDEPENDENT_AMBULATORY_CARE_PROVIDER_SITE_OTHER): Payer: Commercial Managed Care - PPO

## 2019-02-08 ENCOUNTER — Ambulatory Visit: Payer: Commercial Managed Care - PPO | Admitting: Podiatry

## 2019-02-08 ENCOUNTER — Other Ambulatory Visit: Payer: Self-pay

## 2019-02-08 ENCOUNTER — Encounter: Payer: Self-pay | Admitting: Podiatry

## 2019-02-08 VITALS — Temp 98.1°F

## 2019-02-08 DIAGNOSIS — M779 Enthesopathy, unspecified: Secondary | ICD-10-CM

## 2019-02-08 DIAGNOSIS — M79671 Pain in right foot: Secondary | ICD-10-CM

## 2019-02-08 DIAGNOSIS — M7751 Other enthesopathy of right foot: Secondary | ICD-10-CM | POA: Diagnosis not present

## 2019-02-08 DIAGNOSIS — M1000 Idiopathic gout, unspecified site: Secondary | ICD-10-CM | POA: Diagnosis not present

## 2019-02-08 MED ORDER — TRIAMCINOLONE ACETONIDE 10 MG/ML IJ SUSP
10.0000 mg | Freq: Once | INTRAMUSCULAR | Status: AC
  Administered 2019-02-08: 14:00:00 10 mg

## 2019-02-08 NOTE — Patient Instructions (Signed)

## 2019-02-09 NOTE — Progress Notes (Signed)
Subjective:   Patient ID: Steven Davenport, male   DOB: 84 y.o.   MRN: [id number]   HPI Patient presents stating that the big toe joint right has started to become sore again and is not sure if it is related to the joint itself or if it could be a gout flareup.  Patient states her sugars been running well   ROS      Objective:  Physical Exam  Neurovascular status intact with inflammation of the first MPJ right and history of occasional gout attacks periodically     Assessment:  Inflammatory capsulitis first MPJ right with possibility for gout or possibility that this is related to the structure of the joint     Plan:  H&P condition reviewed and I reviewed gout and diet to watch out for and gave him instructions on foods to watch out for.  I reviewed his x-rays and today I injected the first MPJ 3 mg Kenalog 5 mg Xylocaine and will review view again if symptoms indicate  X-ray indicates no indications that there is been a progression of the arthritis with moderate changes around the first MPJ with cyst formation and narrowing of the joint surface

## 2019-07-07 ENCOUNTER — Other Ambulatory Visit: Payer: Self-pay

## 2019-07-07 ENCOUNTER — Ambulatory Visit (INDEPENDENT_AMBULATORY_CARE_PROVIDER_SITE_OTHER): Payer: Commercial Managed Care - PPO

## 2019-07-07 ENCOUNTER — Ambulatory Visit: Payer: Commercial Managed Care - PPO | Admitting: Podiatry

## 2019-07-07 DIAGNOSIS — M779 Enthesopathy, unspecified: Secondary | ICD-10-CM

## 2019-07-07 DIAGNOSIS — M1000 Idiopathic gout, unspecified site: Secondary | ICD-10-CM | POA: Diagnosis not present

## 2019-07-07 DIAGNOSIS — M76822 Posterior tibial tendinitis, left leg: Secondary | ICD-10-CM

## 2019-07-07 MED ORDER — COLCHICINE 0.6 MG PO TABS: 0.6000 mg | ORAL_TABLET | Freq: Every day | ORAL | 1 refills | Status: DC

## 2019-07-10 NOTE — Progress Notes (Signed)
Subjective:   Patient ID: Steven Davenport, male   DOB: 84 y.o.   MRN: [id number]   HPI Patient presents stating has had some pain in the inside of his left ankle and is also concerned about gout and would like medicine for it.  States it is been hurting for several months   ROS      Objective:  Physical Exam  Neurovascular status intact with inflammation pain to the posterior tibial tendon left with moderate depression of the arch and is noted to have history of gout and has not had medication recently     Assessment:  Probability for inflammatory tendinitis left secondary to foot structure with possibility for gout and history of gout     Plan:  H&P all conditions discussed and at this point I did do careful sheath injection left 3 mg Kenalog 5 mg Xylocaine and then discussed the gout and we are to start him on colchicine 0.6 mg on an as-needed basis.  Educated him on this  X-ray imaging indicated that there is moderate depression of the arch but no indications of acute pathology currently

## 2019-09-01 ENCOUNTER — Other Ambulatory Visit: Payer: Self-pay | Admitting: Podiatry

## 2019-10-01 ENCOUNTER — Other Ambulatory Visit: Payer: Self-pay | Admitting: Podiatry

## 2019-12-28 ENCOUNTER — Ambulatory Visit: Payer: Commercial Managed Care - PPO

## 2020-01-01 ENCOUNTER — Other Ambulatory Visit: Payer: Self-pay

## 2020-01-01 ENCOUNTER — Ambulatory Visit: Payer: Commercial Managed Care - PPO

## 2020-01-01 ENCOUNTER — Ambulatory Visit: Payer: Commercial Managed Care - PPO | Attending: Family

## 2020-01-01 DIAGNOSIS — Z23 Encounter for immunization: Secondary | ICD-10-CM | POA: Insufficient documentation

## 2020-01-01 NOTE — Progress Notes (Signed)
   Covid-19 Vaccination Clinic  Name:  Steven Davenport    MRN: [id number] DOB: [birth year]  01/01/2020  Steven Davenport was observed post Covid-19 immunization for 15 minutes without incidence. He was provided with Vaccine Information Sheet and instruction to access the V-Safe system.   Steven Davenport was instructed to call 911 with any severe reactions post vaccine: Marland Kitchen Difficulty breathing  . Swelling of your face and throat  . A fast heartbeat  . A bad rash all over your body  . Dizziness and weakness    Immunizations Administered    Name Date Dose VIS Date Route   Moderna COVID-19 Vaccine 01/01/2020  3:46 PM 0.5 mL 10/10/2019 Intramuscular   Manufacturer: Moderna   Lot: [id number]   [id number]

## 2020-02-13 ENCOUNTER — Ambulatory Visit: Payer: Medicare Other | Attending: Family

## 2020-02-13 DIAGNOSIS — Z23 Encounter for immunization: Secondary | ICD-10-CM

## 2020-02-13 NOTE — Progress Notes (Signed)
   Covid-19 Vaccination Clinic  Name:  Steven Davenport    MRN: [id number] DOB: [birth year]  02/13/2020  Mr. Steven Davenport was observed post Covid-19 immunization for 15 minutes without incident. He was provided with Vaccine Information Sheet and instruction to access the V-Safe system.   Mr. Steven Davenport was instructed to call 911 with any severe reactions post vaccine: Marland Kitchen Difficulty breathing  . Swelling of face and throat  . A fast heartbeat  . A bad rash all over body  . Dizziness and weakness   Immunizations Administered    Name Date Dose VIS Date Route   Moderna COVID-19 Vaccine 02/13/2020 11:54 AM 0.5 mL 10/10/2019 Intramuscular   Manufacturer: Moderna   Lot: [id number]   [id number]

## 2020-03-15 ENCOUNTER — Encounter (HOSPITAL_COMMUNITY): Payer: Self-pay | Admitting: Family Medicine

## 2020-03-15 ENCOUNTER — Ambulatory Visit (HOSPITAL_COMMUNITY)
Admission: EM | Admit: 2020-03-15 | Discharge: 2020-03-15 | Disposition: A | Discharge status: Home / Self Care | Payer: Medicare Other | Attending: Family Medicine | Admitting: Family Medicine

## 2020-03-15 ENCOUNTER — Other Ambulatory Visit: Payer: Self-pay

## 2020-03-15 DIAGNOSIS — S0501XA Injury of conjunctiva and corneal abrasion without foreign body, right eye, initial encounter: Secondary | ICD-10-CM

## 2020-03-15 MED ORDER — TETRACAINE HCL 0.5 % OP SOLN
OPHTHALMIC | Status: AC
  Filled 2020-03-15: qty 4

## 2020-03-15 MED ORDER — ERYTHROMYCIN 5 MG/GM OP OINT: TOPICAL_OINTMENT | OPHTHALMIC | 1 refills | Status: AC

## 2020-03-15 MED ORDER — FLUORESCEIN SODIUM 1 MG OP STRP
ORAL_STRIP | OPHTHALMIC | Status: AC
  Filled 2020-03-15: qty 1

## 2020-03-15 MED ORDER — EYE WASH OPHTH SOLN
OPHTHALMIC | Status: AC
  Filled 2020-03-15: qty 118

## 2020-03-15 NOTE — ED Triage Notes (Signed)
Pt reports he was blowing leaves yesterday at work and something got into his right eye.

## 2020-03-15 NOTE — Discharge Instructions (Addendum)
Return if eye continues to bother you for another day or two.  Avoid pressure hose unless wearing goggles.

## 2020-03-15 NOTE — ED Provider Notes (Signed)
[facility]    CSN: [id number] Arrival date & time: 03/15/20  [id number]      History   Chief Complaint Chief Complaint  Patient presents with  . Foreign Body in [place] is a 85 y.o. male.   85 yo man making initial visit to [facility], complaining of FB in right eye that happened after air hosing at work at [employer] on Wednesday.  Continued FB sensation since.  No change in visual acuity.  Patient says that his left eye is somewhat itchy.     Past Medical History:  Diagnosis Date  . Diabetes mellitus without complication ([place])   . Hyperlipidemia   . Hypertension     There are no problems to display for this patient.   History reviewed. No pertinent surgical history.     Home Medications    Prior to Admission medications   Medication Sig Start Date End Date Taking? Authorizing Provider  allopurinol (ZYLOPRIM) 100 MG tablet Take 1 tablet (100 mg total) by mouth daily. 06/27/18   Wallene Huh, DPM  amLODipine (NORVASC) 10 MG tablet Take 10 mg by mouth daily.    [provider]  atenolol (TENORMIN) 100 MG tablet TK 1 T PO QD 02/04/19   [provider]  atorvastatin (LIPITOR) 10 MG tablet Take 10 mg by mouth daily.    [provider]  atorvastatin (LIPITOR) 40 MG tablet TK 1 T PO ONCE D 05/01/18   [provider]  colchicine 0.6 MG tablet TAKE 1 TABLET(0.6 MG) BY MOUTH DAILY 09/01/19   Wallene Huh, DPM  diclofenac (VOLTAREN) 75 MG EC tablet  07/12/18   [provider]  erythromycin ophthalmic ointment Place a 1/2 inch ribbon of ointment into the lower eyelid. 03/15/20   Robyn Haber, MD  JANUVIA 100 MG tablet TK 1 T PO QD 02/03/19   [provider]  meloxicam (MOBIC) 7.5 MG tablet Take 1 tablet (7.5 mg total) by mouth daily. 07/09/17   Evelina Bucy, DPM  metFORMIN (GLUCOPHAGE) 500 MG tablet TK 1/2 T PO BID 05/01/18   [provider]  quinapril (ACCUPRIL) 10 MG tablet Take 10 mg by mouth  daily.    [provider]  quinapril-hydrochlorothiazide (ACCURETIC) 20-12.5 MG tablet TK 2 TS PO ONCE D 05/01/18   [provider]    Family History History reviewed. No pertinent family history.  Social History Social History   Tobacco Use  . Smoking status: Never Smoker  . Smokeless tobacco: Never Used  Substance Use Topics  . Alcohol use: No  . Drug use: No     Allergies   Patient has no known allergies.   Review of Systems Review of Systems  Eyes: Positive for pain, redness and itching. Negative for photophobia and discharge.  All other systems reviewed and are negative.    Physical Exam Triage Vital Signs ED Triage Vitals  Enc Vitals Group     BP      Pulse      Resp      Temp      Temp src      SpO2      Weight      Height      Head Circumference      Peak Flow      Pain Score      Pain Loc      Pain Edu?      Excl. in [place]?  No data found.  Updated Vital Signs BP (!) 150/87 (BP Location: Right Arm)   Pulse 61   Temp 98.2 F (36.8 C) (Oral)   Resp 18   SpO2 97%   Visual Acuity Right Eye Distance: 20/100(Without correction. ) Left Eye Distance: 20/100(Without correction. ) Bilateral Distance: 20/100(Without correction. )   Physical Exam Vitals and nursing note reviewed.  Constitutional:      Appearance: Normal appearance. He is obese.  HENT:     Mouth/Throat:     Mouth: Mucous membranes are moist.  Eyes:     General: No scleral icterus.    Comments: Mild injection of right sclera  Pulmonary:     Effort: Pulmonary effort is normal.  Musculoskeletal:        General: Normal range of motion.     Cervical back: Normal range of motion and neck supple.  Skin:    General: Skin is warm and dry.  Neurological:     General: No focal deficit present.     Mental Status: He is alert and oriented to person, place, and time.  Psychiatric:        Mood and Affect: Mood normal.   Eyes were inspected and no foreign bodies were  seen. Right eye is mildly injected and lid was everted with no foreign body seen on the underside of the lid. Right eye was stained and there is a small blush on the medial side about 3:00 of the cornea.  Once again no foreign body was seen   UC Treatments / Results  Labs (all labs ordered are listed, but only abnormal results are displayed) Labs Reviewed - No data to display  EKG   Radiology No results found.  Procedures Procedures (including critical care time)  Medications Ordered in UC Medications - No data to display  Initial Impression / Assessment and Plan / UC Course  I have reviewed the triage vital signs and the nursing notes.  Pertinent labs & imaging results that were available during my care of the patient were reviewed by me and considered in my medical decision making (see chart for details).    Final Clinical Impressions(s) / UC Diagnoses   Final diagnoses:  Abrasion of right cornea, initial encounter     Discharge Instructions     Return if eye continues to bother you for another day or two.  Avoid pressure hose unless wearing goggles.    ED Prescriptions    Medication Sig Dispense Auth. Provider   erythromycin ophthalmic ointment Place a 1/2 inch ribbon of ointment into the lower eyelid. 1 g Robyn Haber, MD     I have reviewed the PDMP during this encounter.   Robyn Haber, MD 03/15/20 [phone]

## 2020-03-28 ENCOUNTER — Other Ambulatory Visit: Payer: Self-pay | Admitting: Urology

## 2020-03-28 DIAGNOSIS — C61 Malignant neoplasm of prostate: Secondary | ICD-10-CM

## 2020-04-26 ENCOUNTER — Encounter (HOSPITAL_COMMUNITY)
Admission: RE | Admit: 2020-04-26 | Discharge: 2020-04-26 | Disposition: A | Discharge status: Home / Self Care | Payer: Commercial Managed Care - PPO | Source: Ambulatory Visit | Attending: Urology | Admitting: Urology

## 2020-04-26 ENCOUNTER — Other Ambulatory Visit: Payer: Self-pay

## 2020-04-26 DIAGNOSIS — C61 Malignant neoplasm of prostate: Secondary | ICD-10-CM | POA: Insufficient documentation

## 2020-04-26 MED ORDER — TECHNETIUM TC 99M MEDRONATE IV KIT
20.0000 | PACK | Freq: Once | INTRAVENOUS | Status: AC | PRN
  Administered 2020-04-26: 21.5 via INTRAVENOUS

## 2020-09-10 ENCOUNTER — Ambulatory Visit: Payer: Commercial Managed Care - PPO | Attending: Internal Medicine

## 2020-09-10 DIAGNOSIS — Z23 Encounter for immunization: Secondary | ICD-10-CM

## 2020-09-10 NOTE — Progress Notes (Signed)
   Covid-19 Vaccination Clinic  Name:  Steven Davenport    MRN: [id number] DOB: [birth year]  09/10/2020  [name] was observed post Covid-19 immunization for 15 minutes without incident. He was provided with Vaccine Information Sheet and instruction to access the V-Safe system.   [name] was instructed to call 911 with any severe reactions post vaccine: Marland Kitchen Difficulty breathing  . Swelling of face and throat  . A fast heartbeat  . A bad rash all over body  . Dizziness and weakness

## 2020-09-20 ENCOUNTER — Other Ambulatory Visit: Payer: Self-pay | Admitting: Physician Assistant

## 2020-09-20 ENCOUNTER — Ambulatory Visit
Admission: RE | Admit: 2020-09-20 | Discharge: 2020-09-20 | Disposition: A | Discharge status: Home / Self Care | Payer: Commercial Managed Care - PPO | Source: Ambulatory Visit | Attending: Physician Assistant | Admitting: Physician Assistant

## 2020-09-20 DIAGNOSIS — M25551 Pain in right hip: Secondary | ICD-10-CM

## 2020-09-20 DIAGNOSIS — M25559 Pain in unspecified hip: Secondary | ICD-10-CM

## 2020-10-24 ENCOUNTER — Ambulatory Visit: Payer: Commercial Managed Care - PPO | Admitting: Podiatry

## 2020-10-25 ENCOUNTER — Other Ambulatory Visit: Payer: Self-pay

## 2020-10-25 ENCOUNTER — Encounter: Payer: Self-pay | Admitting: Podiatry

## 2020-10-25 ENCOUNTER — Ambulatory Visit (INDEPENDENT_AMBULATORY_CARE_PROVIDER_SITE_OTHER): Payer: Medicare Other | Admitting: Podiatry

## 2020-10-25 ENCOUNTER — Other Ambulatory Visit: Payer: Self-pay | Admitting: Podiatry

## 2020-10-25 DIAGNOSIS — M722 Plantar fascial fibromatosis: Secondary | ICD-10-CM

## 2020-10-25 DIAGNOSIS — L600 Ingrowing nail: Secondary | ICD-10-CM

## 2020-10-25 DIAGNOSIS — M76822 Posterior tibial tendinitis, left leg: Secondary | ICD-10-CM | POA: Diagnosis not present

## 2020-10-25 MED ORDER — TRIAMCINOLONE ACETONIDE 10 MG/ML IJ SUSP
10.0000 mg | Freq: Once | INTRAMUSCULAR | Status: AC
  Administered 2020-10-25: 10 mg

## 2020-10-25 NOTE — Progress Notes (Signed)
Subjective:   Patient ID: Steven Davenport, male   DOB: 85 y.o.   MRN: [id number]   HPI Patient states my nail is really bothering me on the one side and I am having a lot of pain in the side of the foot and its been over a year since I have been here   ROS      Objective:  Physical Exam  Neurovascular status intact with patient failure to have exquisite discomfort at the posterior tibial insertion right and also an incurvated nail right hallux medial border painful when pressed making shoe gear difficult     Assessment:  Chronic ingrown toenail deformity painful right hallux and posterior tibial tendinitis right     Plan:  H&P both conditions discussed.  At this time for the nails I recommended correction explained procedure risk and patient wants procedure and I infiltrated 60 mg like Marcaine mixture sterile prep done using sterile instrumentation removed the medial border exposed matrix applied phenol 3 applications 30 seconds followed by alcohol lavage sterile dressing and gave instructions on soaks and to leave dressing on 24 hours but take it off earlier if necessary.  I then did sterile prep and injected the posterior tibial tendon insertion 3 mg Dexasone Kenalog 5 mg Xylocaine and advised on supportive shoes

## 2020-10-25 NOTE — Patient Instructions (Signed)

## 2021-04-16 ENCOUNTER — Other Ambulatory Visit (HOSPITAL_COMMUNITY): Payer: Self-pay | Admitting: Urology

## 2021-04-16 DIAGNOSIS — C61 Malignant neoplasm of prostate: Secondary | ICD-10-CM

## 2021-05-07 ENCOUNTER — Encounter (HOSPITAL_COMMUNITY)
Admission: RE | Admit: 2021-05-07 | Discharge: 2021-05-07 | Disposition: A | Discharge status: Home / Self Care | Payer: Commercial Managed Care - PPO | Source: Ambulatory Visit | Attending: Urology | Admitting: Urology

## 2021-05-07 ENCOUNTER — Ambulatory Visit (HOSPITAL_COMMUNITY)
Admission: RE | Admit: 2021-05-07 | Discharge: 2021-05-07 | Disposition: A | Discharge status: Home / Self Care | Payer: Commercial Managed Care - PPO | Source: Ambulatory Visit | Attending: Urology | Admitting: Urology

## 2021-05-07 ENCOUNTER — Other Ambulatory Visit: Payer: Self-pay

## 2021-05-07 DIAGNOSIS — C61 Malignant neoplasm of prostate: Secondary | ICD-10-CM

## 2021-05-07 MED ORDER — TECHNETIUM TC 99M MEDRONATE IV KIT
20.8000 | PACK | Freq: Once | INTRAVENOUS | Status: AC
  Administered 2021-05-07: 20.8 via INTRAVENOUS

## 2021-05-28 ENCOUNTER — Other Ambulatory Visit: Payer: Self-pay

## 2021-05-28 ENCOUNTER — Ambulatory Visit: Payer: Commercial Managed Care - PPO | Attending: Internal Medicine

## 2021-05-28 DIAGNOSIS — Z23 Encounter for immunization: Secondary | ICD-10-CM

## 2021-05-29 ENCOUNTER — Other Ambulatory Visit (HOSPITAL_BASED_OUTPATIENT_CLINIC_OR_DEPARTMENT_OTHER): Payer: Self-pay

## 2021-05-29 MED ORDER — COVID-19 MRNA VACC (MODERNA) 100 MCG/0.5ML IM SUSP
INTRAMUSCULAR | 0 refills | Status: AC
  Filled 2021-05-29: qty 0.25, 1d supply, fill #0

## 2021-05-29 NOTE — Progress Notes (Signed)
   Covid-19 Vaccination Clinic  Name:  ROLLO FARQUHAR    MRN: [id number] DOB: 11/03/1935  05/29/2021  Mr. Westervelt was observed post Covid-19 immunization for 15 minutes without incident. He was provided with Vaccine Information Sheet and instruction to access the V-Safe system.   Mr. Parkison was instructed to call 911 with any severe reactions post vaccine: Difficulty breathing  Swelling of face and throat  A fast heartbeat  A bad rash all over body  Dizziness and weakness   Immunizations Administered     Name Date Dose VIS Date Route   Moderna Covid-19 Booster Vaccine 05/28/2021  3:02 PM 0.25 mL 08/28/2020 Intramuscular   Manufacturer: Moderna   Lot: [id number]   [place]: [phone]

## 2021-08-09 IMAGING — NM NM BONE WHOLE BODY
2 series · 2 of 2 positions shown · non-contrast
Comparison: Bone scan July 01, 2018.  CT scan April 26, 2020.

CLINICAL DATA: Prostate cancer.  Rising PSA.

EXAM:
NUCLEAR MEDICINE WHOLE BODY BONE SCAN
TECHNIQUE: Whole body anterior and posterior images were obtained approximately
3 hours after intravenous injection of radiopharmaceutical.
RADIOPHARMACEUTICALS:  21.5 mCi 3echnetium-BBm MDP IV

[Series 1: wbr_bone_40 whole body · 2.66mm/px · 1 of 1 slices shown (1 of 2)]
[im 1/1]
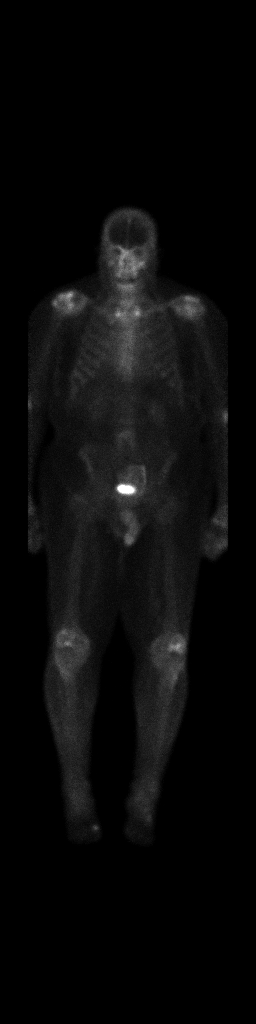

[Series 1: wbr_bone_40 whole body · 2.66mm/px · 1 of 1 slices shown (2 of 2)]
[im 1/1]
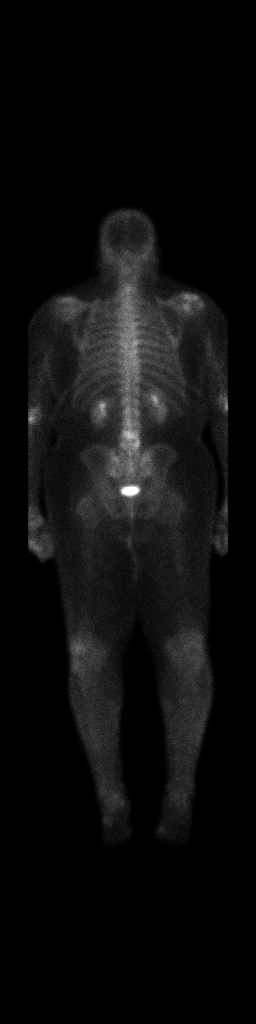

[2 of 2 positions shown; findings below may reference images not displayed]

FINDINGS: Increasing uptake in the right shoulder is likely degenerative
involving the glenohumeral joint, the AC joint, and the rotator cuff
insertion site. More mild degenerative changes are seen in the left
shoulder. Sternoclavicular degenerative changes are noted
bilaterally and symmetrically. Degenerative changes are seen in the
right great toe in the bilateral knees. Uptake in the lower lumbar
spine correlates with degenerative changes seen on CT imaging. No
convincing evidence of bony metastatic disease identified.
IMPRESSION: Degenerative changes as above. No scintigraphic evidence of bony
metastatic disease.

## 2022-05-11 ENCOUNTER — Other Ambulatory Visit: Payer: Self-pay | Admitting: Physician Assistant

## 2022-05-11 ENCOUNTER — Ambulatory Visit
Admission: RE | Admit: 2022-05-11 | Discharge: 2022-05-11 | Disposition: A | Discharge status: Home / Self Care | Payer: Medicare Other | Source: Ambulatory Visit | Attending: Physician Assistant | Admitting: Physician Assistant

## 2022-05-11 DIAGNOSIS — M25512 Pain in left shoulder: Secondary | ICD-10-CM

## 2022-08-20 IMAGING — NM NM BONE WHOLE BODY
2 series · 2 of 2 positions shown · non-contrast
Comparison: 05/26/2020.  07/01/2018.

CLINICAL DATA: Prostate cancer.  Elevated PSA.  Low back pain.

EXAM:
NUCLEAR MEDICINE WHOLE BODY BONE SCAN
TECHNIQUE: Whole body anterior and posterior images were obtained approximately
3 hours after intravenous injection of radiopharmaceutical.
RADIOPHARMACEUTICALS:  MCi Jechnetium-SSm MDP IV

[Series 1: wbr_bone_40 whole body · 2.66mm/px · 1 of 1 slices shown (1 of 2)]
[im 1/1]
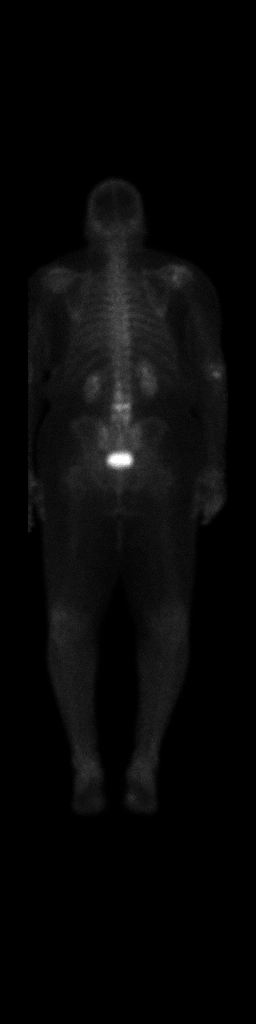

[Series 1: wbr_bone_40 whole body · 2.66mm/px · 1 of 1 slices shown (2 of 2)]
[im 1/1]
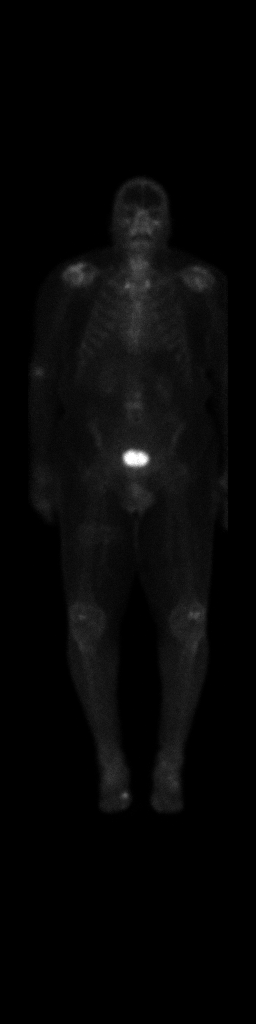

[2 of 2 positions shown; findings below may reference images not displayed]

FINDINGS: No new worrisome finding suggestive of metastatic disease.
Persistent and slightly increased endplate activity at L4 and L5
likely reflecting degenerative disc disease. Chronic degenerative
arthritic changes of the shoulders, knees and feet.
IMPRESSION: No worrisome skeletal activity. Chronic degenerative changes as
outlined above. Endplate activity in the lumbar spine quite likely
to be degenerative rather than metastatic.

## 2022-08-21 ENCOUNTER — Other Ambulatory Visit (HOSPITAL_BASED_OUTPATIENT_CLINIC_OR_DEPARTMENT_OTHER): Payer: Self-pay

## 2022-08-21 MED ORDER — COVID-19 MRNA 2023-2024 VACCINE (COMIRNATY) 0.3 ML INJECTION
INTRAMUSCULAR | 0 refills | Status: AC
  Filled 2022-08-21: qty 0.3, 1d supply, fill #0

## 2022-10-16 ENCOUNTER — Ambulatory Visit (INDEPENDENT_AMBULATORY_CARE_PROVIDER_SITE_OTHER): Payer: Commercial Managed Care - PPO | Admitting: Podiatry

## 2022-10-16 VITALS — BP 130/73 | HR 64

## 2022-10-16 DIAGNOSIS — M7751 Other enthesopathy of right foot: Secondary | ICD-10-CM | POA: Diagnosis not present

## 2022-10-16 DIAGNOSIS — L600 Ingrowing nail: Secondary | ICD-10-CM | POA: Diagnosis not present

## 2022-10-16 MED ORDER — TRIAMCINOLONE ACETONIDE 10 MG/ML IJ SUSP
10.0000 mg | Freq: Once | INTRAMUSCULAR | Status: AC
  Administered 2022-10-16: 10 mg

## 2022-10-16 NOTE — Patient Instructions (Signed)

## 2022-10-17 NOTE — Progress Notes (Signed)
Subjective:   Patient ID: Steven Davenport, male   DOB: 87 y.o.   MRN: [id number]   HPI Patient presents stating that he is having pain in his big toe right and it seems that there is an ingrown toenail and is also developing pain in his right big toe joint that is been recent.  States the nailbed is sore   ROS      Objective:  Physical Exam  Neurovascular status intact with an incurvated spicule in the right hallux medial side localized that is painful when I pressed and also inflammation around the big toe joint right that can become painful     Assessment:  Inflammatory capsulitis right first MPJ along with ingrown toenail deformity right hallux medial border painful     Plan:  H&P reviewed both conditions.  For the nailbed I have recommended removal of the border I explained procedure risk he wants this done and signed consent form.  I infiltrated 60 mg Xylocaine Marcaine mixture sterile prep done using sterile instrumentation remove the medial border removed spicule exposed matrix applied phenol 3 applications 30 seconds followed by alcohol lavage sterile dressing gave instructions on soaks and did go ahead did a periarticular injection around the first MPJ 3 mg Kenalog 5 mg Xylocaine.  Reappoint as symptoms indicate and encouraged to call questions concerns

## 2023-01-27 ENCOUNTER — Ambulatory Visit (INDEPENDENT_AMBULATORY_CARE_PROVIDER_SITE_OTHER): Payer: Commercial Managed Care - PPO | Admitting: Podiatry

## 2023-01-27 ENCOUNTER — Encounter: Payer: Self-pay | Admitting: Podiatry

## 2023-01-27 DIAGNOSIS — L600 Ingrowing nail: Secondary | ICD-10-CM | POA: Diagnosis not present

## 2023-01-27 NOTE — Patient Instructions (Signed)

## 2023-01-27 NOTE — Progress Notes (Signed)
Subjective:   Patient ID: Steven Davenport, male   DOB: 88 y.o.   MRN: [id number]   HPI Patient presents stating the nail has been bothering me and I just think I need to get a trend   ROS      Objective:  Physical Exam  Neurovascular status intact with keratotic and thick nail bed on the medial side of the right hallux with mild pain     Assessment:  Ingrown toenail deformity right hallux medial side moderate discomfort     Plan:  Sterile debridement accomplished no angiogenic bleeding hopefully this will solve the problem reappoint to recheck

## 2023-09-21 ENCOUNTER — Other Ambulatory Visit (HOSPITAL_COMMUNITY): Payer: Self-pay | Admitting: Urology

## 2023-09-21 DIAGNOSIS — R9721 Rising PSA following treatment for malignant neoplasm of prostate: Secondary | ICD-10-CM

## 2023-10-04 ENCOUNTER — Encounter (HOSPITAL_COMMUNITY)
Admission: RE | Admit: 2023-10-04 | Discharge: 2023-10-04 | Disposition: A | Discharge status: Home / Self Care | Payer: Commercial Managed Care - PPO | Source: Ambulatory Visit | Attending: Urology | Admitting: Urology

## 2023-10-04 DIAGNOSIS — R9721 Rising PSA following treatment for malignant neoplasm of prostate: Secondary | ICD-10-CM | POA: Diagnosis present

## 2023-10-04 MED ORDER — FLOTUFOLASTAT F 18 GALLIUM 296-5846 MBQ/ML IV SOLN
8.0000 | Freq: Once | INTRAVENOUS | Status: AC
  Administered 2023-10-04: 8.3 via INTRAVENOUS
  Filled 2023-10-04: qty 8

## 2023-10-27 ENCOUNTER — Encounter: Payer: Self-pay | Admitting: Podiatry

## 2023-10-27 ENCOUNTER — Ambulatory Visit (INDEPENDENT_AMBULATORY_CARE_PROVIDER_SITE_OTHER): Payer: Commercial Managed Care - PPO | Admitting: Podiatry

## 2023-10-27 DIAGNOSIS — M7751 Other enthesopathy of right foot: Secondary | ICD-10-CM

## 2023-10-28 NOTE — Progress Notes (Signed)
Subjective:   Patient ID: Steven Davenport, male   DOB: 88 y.o.   MRN: [id number]   HPI Patient presents with a lot of pain around his big toe joint right states it has been inflamed and sore   ROS      Objective:  Physical Exam  Neurovascular status intact with inflammation fluid around the first MPJ right painful when pressed moderate reduced range of motion     Assessment:  Inflammatory capsulitis that has reoccurred around the big toe joint right     Plan:  H&P reviewed sterile prep injected around the joint 3 mg dexamethasone Kenalog 5 mg Xylocaine periarticular and applied sterile dressing.  Reappoint as symptoms indicate

## 2024-07-12 ENCOUNTER — Other Ambulatory Visit: Payer: Self-pay | Admitting: Physician Assistant

## 2024-07-12 ENCOUNTER — Ambulatory Visit
Admission: RE | Admit: 2024-07-12 | Discharge: 2024-07-12 | Disposition: A | Discharge status: Home / Self Care | Source: Ambulatory Visit | Attending: Physician Assistant | Admitting: Physician Assistant

## 2024-07-12 ENCOUNTER — Encounter: Payer: Self-pay | Admitting: Physician Assistant

## 2024-07-12 DIAGNOSIS — E1169 Type 2 diabetes mellitus with other specified complication: Secondary | ICD-10-CM | POA: Diagnosis not present

## 2024-07-12 DIAGNOSIS — R0609 Other forms of dyspnea: Secondary | ICD-10-CM | POA: Diagnosis not present

## 2024-07-12 DIAGNOSIS — Z23 Encounter for immunization: Secondary | ICD-10-CM | POA: Diagnosis not present

## 2024-07-12 DIAGNOSIS — R54 Age-related physical debility: Secondary | ICD-10-CM | POA: Diagnosis not present

## 2024-07-12 DIAGNOSIS — I10 Essential (primary) hypertension: Secondary | ICD-10-CM | POA: Diagnosis not present

## 2024-07-12 DIAGNOSIS — R0602 Shortness of breath: Secondary | ICD-10-CM | POA: Diagnosis not present

## 2024-07-12 DIAGNOSIS — E7800 Pure hypercholesterolemia, unspecified: Secondary | ICD-10-CM | POA: Diagnosis not present

## 2024-07-12 DIAGNOSIS — M109 Gout, unspecified: Secondary | ICD-10-CM | POA: Diagnosis not present

## 2024-07-24 ENCOUNTER — Other Ambulatory Visit (HOSPITAL_BASED_OUTPATIENT_CLINIC_OR_DEPARTMENT_OTHER): Payer: Self-pay

## 2024-07-24 MED ORDER — COMIRNATY 30 MCG/0.3ML IM SUSY
0.3000 mL | PREFILLED_SYRINGE | Freq: Once | INTRAMUSCULAR | 0 refills | Status: AC
  Filled 2024-07-24: qty 0.3, 1d supply, fill #0

## 2024-07-24 NOTE — Progress Notes (Unsigned)
 Cardiology Office Note Date:  07/25/2024  ID:  Steven Davenport, DOB 1934-11-29, MRN [id number] PCP:  Steven Reagin, PA  Cardiologist: Steven VEAR Ren Donley, MD  Chief Complaint  Patient presents with   Shortness of Breath      Problems DOE--> x-ray w/ possible infection 9/25 Aortic atherosclerosis Pre-DM on [id number] HA1C 6.3 3/25 ACR 53 3/25 HTN/HLD on atenolol 100, VN-HTZ 80-12.5, AE10, and AN40 LDL 72 3/25  Visits  09/16: No workup needed    History of Present Illness: Steven Davenport is a 89 y.o. male who presents for dyspnea.  Patient denies any symptoms. He was having some back pain and received some medications and it resolved. He denies any CP or dyspnea with exertion. He goes to the gym a couple of times a week. He checks his BP at home and it has been 120s/70s. He denies any pre-syncope.  ROS: Please see the history of present illness. All other systems are reviewed and negative.   Past Medical History:  Diagnosis Date   Aortic atherosclerosis (HCC)    Diabetes mellitus without complication (HCC)    GERD (gastroesophageal reflux disease)    History of prostate cancer    Hypercholesterolemia    Hyperlipidemia    Hypertension    Kidney stones    Prostate cancer (HCC)    PROTEINURIA     No past surgical history on file.  Current Outpatient Medications  Medication Sig Dispense Refill   allopurinol  (ZYLOPRIM ) 100 MG tablet Take 1 tablet (100 mg total) by mouth daily. 30 tablet 6   amLODipine (NORVASC) 10 MG tablet Take 10 mg by mouth daily.     atenolol (TENORMIN) 100 MG tablet TK 1 T PO QD     atorvastatin (LIPITOR) 10 MG tablet Take 10 mg by mouth daily.     atorvastatin (LIPITOR) 40 MG tablet TK 1 T PO ONCE D  4   colchicine  0.6 MG tablet TAKE 1 TABLET(0.6 MG) BY MOUTH DAILY 30 tablet 0   COVID-19 mRNA vaccine 2023-2024 (COMIRNATY ) SUSP injection Inject into the muscle. 0.3 mL 0   COVID-19 mRNA vaccine, Moderna, 100 MCG/0.5ML injection Inject into the  muscle. 0.25 mL 0   COVID-19 mRNA vaccine, Pfizer, (COMIRNATY ) syringe Inject 0.3 mLs into the muscle once for 1 dose. 0.3 mL 0   diclofenac (VOLTAREN) 75 MG EC tablet      erythromycin  ophthalmic ointment Place a 1/2 inch ribbon of ointment into the lower eyelid. 1 g 1   JANUVIA 100 MG tablet TK 1 T PO QD     meloxicam  (MOBIC ) 7.5 MG tablet Take 1 tablet (7.5 mg total) by mouth daily. 30 tablet 0   metFORMIN (GLUCOPHAGE) 500 MG tablet TK 1/2 T PO BID  4   quinapril-hydrochlorothiazide (ACCURETIC) 20-12.5 MG tablet TK 2 TS PO ONCE D  3   Current Facility-Administered Medications  Medication Dose Route Frequency Provider Last Rate Last Admin   triamcinolone  acetonide (KENALOG ) 10 MG/ML injection 10 mg  10 mg Other Once Davenport, Steven S, DPM        Allergies:   Patient has no known allergies.   Social History:  No drinking, smoking or substance use  Family History:  Noncontributory  PHYSICAL EXAM: VS:  BP (!) 154/83   Pulse 69   Ht 5' 7 (1.702 m)   Wt 192 lb 3.2 oz (87.2 kg)   SpO2 99%   BMI 30.10 kg/m  , BMI Body mass index is  30.1 kg/m. GEN: Well nourished, well developed, in no acute distress HEENT: normal Neck: no JVD, carotid bruits, or masses Cardiac: RRR; no murmurs, rubs, or gallops,no edema  Respiratory:  CTAB bilaterally, normal work of breathing GI: soft, nontender, nondistended, + BS Extremities: No LE edema Skin: warm and dry, no rash Neuro:  Strength and sensation are intact  EKG: NSR  Recent Labs: Reviewed  Studies: Reviewed  ASSESSMENT AND PLAN: History of Present Illness: Steven Davenport is a 89 y.o. male who presents for dyspnea.  #DOE #Atherosclerosis #HTN #HLD - Patient denies any DOE or CP. Regarding ASCVD, LDL 72 --> no further therapy indicated - BP is controlled at home on current regimen - Follow up PRN   Signed, Steven VEAR Ren Donley, MD  07/25/2024 9:14 AM    [facility]

## 2024-07-25 ENCOUNTER — Ambulatory Visit

## 2024-07-25 VITALS — BP 154/83 | HR 69 | Ht 67.0 in | Wt 192.2 lb

## 2024-07-25 DIAGNOSIS — E785 Hyperlipidemia, unspecified: Secondary | ICD-10-CM | POA: Diagnosis not present

## 2024-07-25 DIAGNOSIS — R0609 Other forms of dyspnea: Secondary | ICD-10-CM

## 2024-07-25 DIAGNOSIS — R7303 Prediabetes: Secondary | ICD-10-CM | POA: Diagnosis not present

## 2024-07-25 DIAGNOSIS — I10 Essential (primary) hypertension: Secondary | ICD-10-CM

## 2024-07-25 NOTE — Patient Instructions (Signed)
 Medication Instructions:  Your physician recommends that you continue on your current medications as directed. Please refer to the Current Medication list given to you today.  *If you need a refill on your cardiac medications before your next appointment, please call your pharmacy*  Lab Work: None ordered.  If you have labs (blood work) drawn today and your tests are completely normal, you will receive your results only by: MyChart Message (if you have MyChart) OR A paper copy in the mail If you have any lab test that is abnormal or we need to change your treatment, we will call you to review the results.  Testing/Procedures: None ordered.   Follow-Up: At [facility], you and your health needs are our priority.  As part of our continuing mission to provide you with exceptional heart care, our providers are all part of one team.  This team includes your primary Cardiologist (physician) and Advanced Practice Providers or APPs (Physician Assistants and Nurse Practitioners) who all work together to provide you with the care you need, when you need it.  Your next appointment:   Follow up as needed with Dr Ren

## 2024-08-10 DIAGNOSIS — R9389 Abnormal findings on diagnostic imaging of other specified body structures: Secondary | ICD-10-CM | POA: Diagnosis not present

## 2024-08-10 DIAGNOSIS — K219 Gastro-esophageal reflux disease without esophagitis: Secondary | ICD-10-CM | POA: Diagnosis not present

## 2024-08-10 DIAGNOSIS — E7800 Pure hypercholesterolemia, unspecified: Secondary | ICD-10-CM | POA: Diagnosis not present

## 2024-08-10 DIAGNOSIS — R809 Proteinuria, unspecified: Secondary | ICD-10-CM | POA: Diagnosis not present

## 2024-08-10 DIAGNOSIS — M109 Gout, unspecified: Secondary | ICD-10-CM | POA: Diagnosis not present

## 2024-08-10 DIAGNOSIS — Z0000 Encounter for general adult medical examination without abnormal findings: Secondary | ICD-10-CM | POA: Diagnosis not present

## 2024-08-10 DIAGNOSIS — E1169 Type 2 diabetes mellitus with other specified complication: Secondary | ICD-10-CM | POA: Diagnosis not present

## 2024-08-10 DIAGNOSIS — R0602 Shortness of breath: Secondary | ICD-10-CM | POA: Diagnosis not present

## 2024-08-10 DIAGNOSIS — I10 Essential (primary) hypertension: Secondary | ICD-10-CM | POA: Diagnosis not present

## 2024-08-11 DIAGNOSIS — E119 Type 2 diabetes mellitus without complications: Secondary | ICD-10-CM | POA: Diagnosis not present

## 2024-10-02 ENCOUNTER — Other Ambulatory Visit (HOSPITAL_COMMUNITY): Payer: Self-pay | Admitting: Urology

## 2024-10-02 DIAGNOSIS — C61 Malignant neoplasm of prostate: Secondary | ICD-10-CM

## 2024-10-13 ENCOUNTER — Encounter (HOSPITAL_COMMUNITY)
Admission: RE | Admit: 2024-10-13 | Discharge: 2024-10-13 | Disposition: A | Source: Ambulatory Visit | Attending: Urology | Admitting: Urology

## 2024-10-13 DIAGNOSIS — C61 Malignant neoplasm of prostate: Secondary | ICD-10-CM

## 2024-10-13 MED ORDER — FLOTUFOLASTAT F 18 GALLIUM 296-5846 MBQ/ML IV SOLN
8.4000 | Freq: Once | INTRAVENOUS | Status: AC
  Administered 2024-10-13: 8.4 via INTRAVENOUS
